# Patient Record
Sex: Male | Born: 1945 | Smoking: Current every day smoker
Health system: Southern US, Community
[De-identification: ages and names within clinical notes are randomized; demographics above are authoritative.]

## PROBLEM LIST (undated history)

## (undated) DIAGNOSIS — C801 Malignant (primary) neoplasm, unspecified: Secondary | ICD-10-CM

## (undated) HISTORY — PX: REVISION UROSTOMY CUTANEOUS: SUR1282

---

## 2018-03-23 ENCOUNTER — Encounter (HOSPITAL_COMMUNITY): Payer: Self-pay

## 2018-03-23 ENCOUNTER — Emergency Department (HOSPITAL_COMMUNITY): Payer: Medicare Other

## 2018-03-23 ENCOUNTER — Inpatient Hospital Stay (HOSPITAL_COMMUNITY)
Admission: EM | Admit: 2018-03-23 | Discharge: 2018-03-25 | DRG: 247 | Disposition: A | Discharge status: Home / Self Care | Payer: Medicare Other | Attending: Cardiology | Admitting: Cardiology

## 2018-03-23 ENCOUNTER — Encounter (HOSPITAL_COMMUNITY)
Admission: EM | Disposition: A | Discharge status: Home / Self Care | Payer: Self-pay | Source: Home / Self Care | Attending: Cardiology

## 2018-03-23 ENCOUNTER — Other Ambulatory Visit: Payer: Self-pay

## 2018-03-23 ENCOUNTER — Ambulatory Visit (HOSPITAL_COMMUNITY): Admit: 2018-03-23 | Payer: Medicare Other | Source: Home / Self Care | Admitting: Cardiology

## 2018-03-23 DIAGNOSIS — F17210 Nicotine dependence, cigarettes, uncomplicated: Secondary | ICD-10-CM | POA: Diagnosis present

## 2018-03-23 DIAGNOSIS — Z8551 Personal history of malignant neoplasm of bladder: Secondary | ICD-10-CM | POA: Diagnosis not present

## 2018-03-23 DIAGNOSIS — I25110 Atherosclerotic heart disease of native coronary artery with unstable angina pectoris: Secondary | ICD-10-CM | POA: Diagnosis present

## 2018-03-23 DIAGNOSIS — R079 Chest pain, unspecified: Secondary | ICD-10-CM | POA: Diagnosis present

## 2018-03-23 DIAGNOSIS — F17200 Nicotine dependence, unspecified, uncomplicated: Secondary | ICD-10-CM

## 2018-03-23 DIAGNOSIS — R739 Hyperglycemia, unspecified: Secondary | ICD-10-CM | POA: Diagnosis present

## 2018-03-23 DIAGNOSIS — I2119 ST elevation (STEMI) myocardial infarction involving other coronary artery of inferior wall: Principal | ICD-10-CM | POA: Diagnosis present

## 2018-03-23 DIAGNOSIS — E785 Hyperlipidemia, unspecified: Secondary | ICD-10-CM | POA: Diagnosis present

## 2018-03-23 DIAGNOSIS — Z906 Acquired absence of other parts of urinary tract: Secondary | ICD-10-CM

## 2018-03-23 DIAGNOSIS — I200 Unstable angina: Secondary | ICD-10-CM | POA: Diagnosis present

## 2018-03-23 DIAGNOSIS — I213 ST elevation (STEMI) myocardial infarction of unspecified site: Secondary | ICD-10-CM | POA: Insufficient documentation

## 2018-03-23 DIAGNOSIS — Z9861 Coronary angioplasty status: Secondary | ICD-10-CM

## 2018-03-23 DIAGNOSIS — Z955 Presence of coronary angioplasty implant and graft: Secondary | ICD-10-CM

## 2018-03-23 HISTORY — PX: LEFT HEART CATH AND CORONARY ANGIOGRAPHY: CATH118249

## 2018-03-23 HISTORY — PX: CORONARY STENT INTERVENTION: CATH118234

## 2018-03-23 HISTORY — DX: Malignant (primary) neoplasm, unspecified: C80.1

## 2018-03-23 LAB — BASIC METABOLIC PANEL
Anion gap: 7 (ref 5–15)
BUN: 10 mg/dL (ref 6–20)
CALCIUM: 8.7 mg/dL — AB (ref 8.9–10.3)
CO2: 25 mmol/L (ref 22–32)
CREATININE: 1.04 mg/dL (ref 0.61–1.24)
Chloride: 103 mmol/L (ref 101–111)
GFR calc non Af Amer: >60 mL/min (ref >60)
Glucose, Bld: 130 mg/dL — ABNORMAL HIGH (ref 65–99)
Potassium: 3.8 mmol/L (ref 3.5–5.1)
SODIUM: 135 mmol/L (ref 135–145)

## 2018-03-23 LAB — CBC
HCT: 45.4 % (ref 39.0–52.0)
Hemoglobin: 16 g/dL (ref 13.0–17.0)
MCH: 33.3 pg (ref 26.0–34.0)
MCHC: 35.2 g/dL (ref 30.0–36.0)
MCV: 94.6 fL (ref 78.0–100.0)
PLATELETS: 140 10*3/uL — AB (ref 150–400)
RBC: 4.8 MIL/uL (ref 4.22–5.81)
RDW: 12.8 % (ref 11.5–15.5)
WBC: 5.3 10*3/uL (ref 4.0–10.5)

## 2018-03-23 LAB — POCT ACTIVATED CLOTTING TIME: Activated Clotting Time: 301 seconds

## 2018-03-23 LAB — I-STAT TROPONIN, ED: TROPONIN I, POC: 0 ng/mL (ref 0.00–0.08)

## 2018-03-23 LAB — GLUCOSE, CAPILLARY: Glucose-Capillary: 108 mg/dL — ABNORMAL HIGH (ref 65–99)

## 2018-03-23 SURGERY — LEFT HEART CATH AND CORONARY ANGIOGRAPHY
Anesthesia: LOCAL

## 2018-03-23 MED ORDER — LIDOCAINE HCL (PF) 1 % IJ SOLN
INTRAMUSCULAR | Status: AC
  Filled 2018-03-23: qty 30

## 2018-03-23 MED ORDER — LIDOCAINE HCL (PF) 1 % IJ SOLN
INTRAMUSCULAR | Status: DC | PRN
  Administered 2018-03-23: 2 mL

## 2018-03-23 MED ORDER — FENTANYL CITRATE (PF) 100 MCG/2ML IJ SOLN
INTRAMUSCULAR | Status: DC | PRN
  Administered 2018-03-23 (×2): 25 ug via INTRAVENOUS

## 2018-03-23 MED ORDER — ATORVASTATIN CALCIUM 80 MG PO TABS
80.0000 mg | ORAL_TABLET | Freq: Every day | ORAL | Status: DC
  Administered 2018-03-23 – 2018-03-24 (×2): 80 mg via ORAL
  Filled 2018-03-23: qty 1
  Filled 2018-03-23: qty 2

## 2018-03-23 MED ORDER — VERAPAMIL HCL 2.5 MG/ML IV SOLN
INTRA_ARTERIAL | Status: DC | PRN
  Administered 2018-03-23: 5 mL via INTRA_ARTERIAL

## 2018-03-23 MED ORDER — ASPIRIN 81 MG PO CHEW
81.0000 mg | CHEWABLE_TABLET | Freq: Every day | ORAL | Status: DC
  Administered 2018-03-24 – 2018-03-25 (×2): 81 mg via ORAL
  Filled 2018-03-23 (×2): qty 1

## 2018-03-23 MED ORDER — SODIUM CHLORIDE 0.9 % IV SOLN
INTRAVENOUS | Status: DC
Start: 2018-03-23 — End: 2018-03-24
  Administered 2018-03-23: 17:00:00 via INTRAVENOUS

## 2018-03-23 MED ORDER — ONDANSETRON HCL 4 MG/2ML IJ SOLN: 4.0000 mg | Freq: Four times a day (QID) | INTRAMUSCULAR | Status: DC | PRN

## 2018-03-23 MED ORDER — TICAGRELOR 90 MG PO TABS
90.0000 mg | ORAL_TABLET | Freq: Two times a day (BID) | ORAL | Status: DC
  Administered 2018-03-24 – 2018-03-25 (×3): 90 mg via ORAL
  Filled 2018-03-23 (×3): qty 1

## 2018-03-23 MED ORDER — ATORVASTATIN CALCIUM 80 MG PO TABS: 80.0000 mg | ORAL_TABLET | Freq: Every day | ORAL | Status: DC

## 2018-03-23 MED ORDER — METOPROLOL SUCCINATE ER 25 MG PO TB24
12.5000 mg | ORAL_TABLET | Freq: Every day | ORAL | Status: DC
  Administered 2018-03-24 – 2018-03-25 (×2): 12.5 mg via ORAL
  Filled 2018-03-23 (×2): qty 1

## 2018-03-23 MED ORDER — IOPAMIDOL (ISOVUE-370) INJECTION 76%
INTRAVENOUS | Status: AC
  Filled 2018-03-23: qty 125

## 2018-03-23 MED ORDER — HEPARIN (PORCINE) IN NACL 100-0.45 UNIT/ML-% IJ SOLN
16.0000 [IU]/kg/h | INTRAMUSCULAR | Status: DC
  Administered 2018-03-23 (×2): 16 [IU]/kg/h via INTRAVENOUS
  Filled 2018-03-23: qty 250

## 2018-03-23 MED ORDER — HEPARIN SODIUM (PORCINE) 1000 UNIT/ML IJ SOLN
INTRAMUSCULAR | Status: DC | PRN
  Administered 2018-03-23: 8000 [IU] via INTRAVENOUS

## 2018-03-23 MED ORDER — SODIUM CHLORIDE 0.9 % WEIGHT BASED INFUSION
1.0000 mL/kg/h | INTRAVENOUS | Status: DC
  Administered 2018-03-23: 1 mL/kg/h via INTRAVENOUS

## 2018-03-23 MED ORDER — FENTANYL CITRATE (PF) 100 MCG/2ML IJ SOLN
INTRAMUSCULAR | Status: AC
  Filled 2018-03-23: qty 2

## 2018-03-23 MED ORDER — HEPARIN SODIUM (PORCINE) 5000 UNIT/ML IJ SOLN
4000.0000 [IU] | Freq: Once | INTRAMUSCULAR | Status: AC
  Administered 2018-03-23: 4000 [IU] via INTRAVENOUS

## 2018-03-23 MED ORDER — PNEUMOCOCCAL VAC POLYVALENT 25 MCG/0.5ML IJ INJ: 0.5000 mL | INJECTION | INTRAMUSCULAR | Status: DC | PRN

## 2018-03-23 MED ORDER — HEPARIN (PORCINE) IN NACL 2-0.9 UNITS/ML
INTRAMUSCULAR | Status: AC | PRN
  Administered 2018-03-23: 1000 mL via INTRA_ARTERIAL

## 2018-03-23 MED ORDER — SODIUM CHLORIDE 0.9% FLUSH: 3.0000 mL | INTRAVENOUS | Status: DC | PRN

## 2018-03-23 MED ORDER — SODIUM CHLORIDE 0.9 % IV SOLN
INTRAVENOUS | Status: AC | PRN
  Administered 2018-03-23: 999 mL/h via INTRAVENOUS

## 2018-03-23 MED ORDER — ACETAMINOPHEN 325 MG PO TABS
650.0000 mg | ORAL_TABLET | ORAL | Status: DC | PRN
  Administered 2018-03-24: 650 mg via ORAL
  Filled 2018-03-23: qty 2

## 2018-03-23 MED ORDER — NITROGLYCERIN 1 MG/10 ML FOR IR/CATH LAB
INTRA_ARTERIAL | Status: DC | PRN
  Administered 2018-03-23 (×2): 200 ug via INTRACORONARY

## 2018-03-23 MED ORDER — HEPARIN SODIUM (PORCINE) 1000 UNIT/ML IJ SOLN
INTRAMUSCULAR | Status: AC
  Filled 2018-03-23: qty 1

## 2018-03-23 MED ORDER — VERAPAMIL HCL 2.5 MG/ML IV SOLN
INTRAVENOUS | Status: AC
  Filled 2018-03-23: qty 2

## 2018-03-23 MED ORDER — SODIUM CHLORIDE 0.9 % IV SOLN: 250.0000 mL | INTRAVENOUS | Status: DC | PRN

## 2018-03-23 MED ORDER — SODIUM CHLORIDE 0.9% FLUSH
3.0000 mL | Freq: Two times a day (BID) | INTRAVENOUS | Status: DC
  Administered 2018-03-24: 3 mL via INTRAVENOUS

## 2018-03-23 MED ORDER — IOHEXOL 350 MG/ML SOLN
INTRAVENOUS | Status: DC | PRN
  Administered 2018-03-23: 50 mL via INTRA_ARTERIAL

## 2018-03-23 MED ORDER — TICAGRELOR 90 MG PO TABS
180.0000 mg | ORAL_TABLET | Freq: Once | ORAL | Status: AC
  Administered 2018-03-23: 180 mg via ORAL
  Filled 2018-03-23: qty 2

## 2018-03-23 MED ORDER — IOPAMIDOL (ISOVUE-370) INJECTION 76%
INTRAVENOUS | Status: DC | PRN
  Administered 2018-03-23: 115 mL via INTRA_ARTERIAL

## 2018-03-23 MED ORDER — MIDAZOLAM HCL 2 MG/2ML IJ SOLN
INTRAMUSCULAR | Status: DC | PRN
  Administered 2018-03-23: 2 mg via INTRAVENOUS

## 2018-03-23 MED ORDER — MIDAZOLAM HCL 2 MG/2ML IJ SOLN
INTRAMUSCULAR | Status: AC
  Filled 2018-03-23: qty 2

## 2018-03-23 SURGICAL SUPPLY — 17 items
BALLN SAPPHIRE 2.5X15 (BALLOONS) ×2
BALLOON SAPPHIRE 2.5X15 (BALLOONS) ×1 IMPLANT
CATH OPTITORQUE TIG 4.0 5F (CATHETERS) ×2 IMPLANT
CATH VISTA GUIDE 6FR JR4 (CATHETERS) ×2 IMPLANT
DEVICE RAD COMP TR BAND LRG (VASCULAR PRODUCTS) ×2 IMPLANT
ELECT DEFIB PAD ADLT CADENCE (PAD) ×2 IMPLANT
GLIDESHEATH SLEND A-KIT 6F 20G (SHEATH) ×2 IMPLANT
GUIDEWIRE INQWIRE 1.5J.035X260 (WIRE) ×1 IMPLANT
INQWIRE 1.5J .035X260CM (WIRE) ×2
KIT ENCORE 26 ADVANTAGE (KITS) ×2 IMPLANT
KIT HEART LEFT (KITS) ×2 IMPLANT
PACK CARDIAC CATHETERIZATION (CUSTOM PROCEDURE TRAY) ×2 IMPLANT
STENT SIERRA 4.00 X 12 MM (Permanent Stent) ×2 IMPLANT
STENT SIERRA 4.00 X 33 MM (Permanent Stent) ×2 IMPLANT
TRANSDUCER W/STOPCOCK (MISCELLANEOUS) ×2 IMPLANT
TUBING CIL FLEX 10 FLL-RA (TUBING) ×2 IMPLANT
WIRE COUGAR XT STRL 190CM (WIRE) ×2 IMPLANT

## 2018-03-23 NOTE — ED Provider Notes (Signed)
[facility] EMERGENCY DEPARTMENT Provider Note   CSN: [id number] Arrival date & time: 03/23/18  1637     History   Chief Complaint Chief Complaint  Patient presents with  . Chest Pain    HPI Cole Garner is a 72 y.o. male.  He has no prior known cardiac history.  Remote history of bladder cancer and a urostomy.  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  Today while going into the [place] to get some food he experienced some substernal chest pressure radiating to his left arm with some associated tingling in the left arm and diaphoresis.  He states he might of been a little short of breath at that time 2.  He is never experienced anything like this before.  EMS was called and they did an EKG which is suspicious for some EKG changes and gave him 324 of aspirin one nitro with improvement in his symptoms.  Currently he states he is got 7 out of 10 pressure across his chest and still little tingling in the left arm.  There is no radiation into the back no pain in the legs.  He has no prior history of cardiac disease no prior EKGs in the system and no cardiac testing known.  The history is provided by the patient and the EMS personnel.  Chest Pain   This is a new problem. The current episode started less than 1 hour ago. The problem has been gradually improving. The pain is associated with walking. The pain is present in the substernal region. The pain is at a severity of 7/10. The quality of the pain is described as pressure-like. The pain radiates to the left shoulder and left arm. Associated symptoms include diaphoresis. Pertinent negatives include no abdominal pain, no back pain, no claudication, no cough, no fever, no headaches, no hemoptysis, no nausea, no near-syncope, no numbness, no palpitations, no shortness of breath, no sputum production, no syncope and no vomiting. He has tried nitroglycerin for the symptoms. The treatment provided moderate relief. Risk factors include male  gender and smoking/tobacco exposure.  His past medical history is significant for cancer.  Pertinent negatives for past medical history include no seizures.    Past Medical History:  Diagnosis Date  . Cancer [facility])    bladder cancer    There are no active problems to display for this patient.   Past Surgical History:  Procedure Laterality Date  . REVISION UROSTOMY CUTANEOUS          Home Medications    Prior to Admission medications   Not on File    Family History No family history on file.  Social History Social History   Tobacco Use  . Smoking status: Current Every Day Smoker  . Smokeless tobacco: Never Used  Substance Use Topics  . Alcohol use: Never    Frequency: Never  . Drug use: Never     Allergies   Patient has no known allergies.   Review of Systems Review of Systems  Constitutional: Positive for diaphoresis. Negative for chills and fever.  HENT: Negative for ear pain and sore throat.   Eyes: Negative for pain and visual disturbance.  Respiratory: Negative for cough, hemoptysis, sputum production and shortness of breath.   Cardiovascular: Positive for chest pain. Negative for palpitations, claudication, syncope and near-syncope.  Gastrointestinal: Negative for abdominal pain, nausea and vomiting.  Genitourinary: Negative for hematuria.  Musculoskeletal: Negative for arthralgias and back pain.  Skin: Negative for color change and  rash.  Neurological: Negative for seizures, syncope, numbness and headaches.  All other systems reviewed and are negative.    Physical Exam Updated Vital Signs BP 123/73 (BP Location: Right Arm)   Pulse (!) 56   Temp 97.6 F (36.4 C) (Oral)   Resp 18   Ht 5\' 11"  (1.803 m)   Wt 81.6 kg (180 lb)   SpO2 98%   BMI 25.10 kg/m   Physical Exam  Constitutional: He appears well-developed and well-nourished.  HENT:  Head: Normocephalic and atraumatic.  Eyes: Conjunctivae are normal.  Neck: Neck supple.    Cardiovascular: Normal rate, regular rhythm and normal pulses.  No murmur heard. Pulmonary/Chest: Effort normal and breath sounds normal. No respiratory distress.  Abdominal: Soft. There is no tenderness.  Urostomy present lower abdomen nontender.  Musculoskeletal: Normal range of motion. He exhibits no edema, tenderness or deformity.  Neurological: He is alert. He has normal strength. No sensory deficit. GCS eye subscore is 4. GCS verbal subscore is 5. GCS motor subscore is 6.  Skin: Skin is warm and dry.  Psychiatric: He has a normal mood and affect.  Nursing note and vitals reviewed.    ED Treatments / Results  Labs (all labs ordered are listed, but only abnormal results are displayed) Labs Reviewed  BASIC METABOLIC PANEL - Abnormal; Notable for the following components:      Result Value   Glucose, Bld 130 (*)    Calcium 8.7 (*)    All other components within normal limits  CBC - Abnormal; Notable for the following components:   Platelets 140 (*)    All other components within normal limits  I-STAT TROPONIN, ED    EKG EKG Interpretation  Date/Time:  Tuesday March 23 2018 16:41:26 EDT Ventricular Rate:  56 PR Interval:    QRS Duration: 90 QT Interval:  421 QTC Calculation: 407 R Axis:   73 Text Interpretation:  Sinus rhythm Repol abnrm suggests ischemia, anterolateral Minimal ST elevation, inferior leads IMI recip lateral depressions no old ecg to compare with Confirmed by Aletta Edouard [phone]) on 03/23/2018 5:06:04 PM   Radiology Dg Chest Port 1 View  Result Date: 03/23/2018 CLINICAL DATA:  Chest pain with tingling sensation across chest radiating to LEFT shoulder, diaphoretic, smoker, history bladder cancer EXAM: PORTABLE CHEST 1 VIEW COMPARISON:  Portable exam 1715 hours without priors for comparison FINDINGS: Normal heart size, mediastinal contours, and pulmonary vascularity. Bibasilar atelectasis. Lungs otherwise clear. No pleural effusion or pneumothorax. No  definite osseous abnormalities. IMPRESSION: Bibasilar atelectasis. Electronically Signed   By: Lavonia Dana M.D.   On: 03/23/2018 17:41    Procedures .Critical Care Performed by: Hayden Rasmussen, MD Authorized by: Hayden Rasmussen, MD   Critical care provider statement:    Critical care time (minutes):  30   Critical care time was exclusive of:  Separately billable procedures and treating other patients   Critical care was necessary to treat or prevent imminent or life-threatening deterioration of the following conditions:  Cardiac failure   Critical care was time spent personally by me on the following activities:  Development of treatment plan with patient or surrogate, discussions with consultants, evaluation of patient's response to treatment, examination of patient, obtaining history from patient or surrogate, ordering and performing treatments and interventions, ordering and review of laboratory studies, ordering and review of radiographic studies, pulse oximetry, re-evaluation of patient's condition and review of old charts   I assumed direction of critical care for this patient from another  provider in my specialty: no     (including critical care time)  Medications Ordered in ED Medications  0.9 %  sodium chloride infusion ( Intravenous New Bag/Given 03/23/18 1715)  heparin ADULT infusion 100 units/mL (25000 units/238mL sodium chloride 0.45%) (16 Units/kg/hr  81.6 kg Intravenous New Bag/Given 03/23/18 1710)  atorvastatin (LIPITOR) tablet 80 mg ( Oral Automatically Held 03/31/18 1800)  midazolam (VERSED) injection (2 mg Intravenous Given 03/23/18 1810)  fentaNYL (SUBLIMAZE) injection (25 mcg Intravenous Given 03/23/18 1810)  lidocaine (PF) (XYLOCAINE) 1 % injection (2 mLs Infiltration Given 03/23/18 1812)  heparin infusion 2 units/mL in 0.9 % sodium chloride (1,000 mLs Intra-arterial New Bag/Given 03/23/18 1812)  Radial Cocktail (Verapamil 2.5 mg, NTG, Lidocaine) (5 mLs Intra-arterial  Given 03/23/18 1815)  0.9 %  sodium chloride infusion (999 mL/hr Intravenous New Bag/Given 03/23/18 1816)  heparin injection (8,000 Units Intravenous Given 03/23/18 1818)  heparin injection 4,000 Units (4,000 Units Intravenous Given 03/23/18 1712)  ticagrelor (BRILINTA) tablet 180 mg (180 mg Oral Given 03/23/18 1710)     Initial Impression / Assessment and Plan / ED Course  I have reviewed the triage vital signs and the nursing notes.  Pertinent labs & imaging results that were available during my care of the patient were reviewed by me and considered in my medical decision making (see chart for details).  Clinical Course as of Mar 23 1817  Tue Mar 23, 2018  1656 Discussed with Dr. Einar Gip the [facility] attending.  He is recommending heparin bolus and infusion Brilinta 180 chewable Lipitor 80 and [facility] is on the way to transport the patient to [facility] for STEMI activation.   [MB]  6226 I updated the patient on his condition and the need for transfer down to [facility] for cardiology evaluation.  Patient is agreeable to plan.   [MB]    Clinical Course User Index [MB] Hayden Rasmussen, MD     Final Clinical Impressions(s) / ED Diagnoses   Final diagnoses:  ST elevation myocardial infarction (STEMI), unspecified artery [facility])    ED Discharge Orders    None       Hayden Rasmussen, MD 03/24/18 1732

## 2018-03-23 NOTE — ED Triage Notes (Signed)
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  Reports was in town today and started having chest pain and was diaphoretic while walking out of a convenient store.  CP radiated from center of chest to left shoulder.  Pt had some chest pain a few days ago and was told he may have had a panic attack.  EMS was called and pt was given 1 SL nitro and 324mg  asa.  CP was relieved with 1 nitro.  Pt has 20g IV in R wrist.  Initial EKG showed some st depression in v2 and v3 but ems says it cleared.

## 2018-03-23 NOTE — Plan of Care (Signed)
  Problem: Education: Goal: Knowledge of General Education information will improve Outcome: Progressing   Problem: Health Behavior/Discharge Planning: Goal: Ability to manage health-related needs will improve Outcome: Progressing   Problem: Clinical Measurements: Goal: Ability to maintain clinical measurements within normal limits will improve Outcome: Progressing Goal: Will remain free from infection Outcome: Progressing Goal: Diagnostic test results will improve Outcome: Progressing Goal: Respiratory complications will improve Outcome: Progressing Goal: Cardiovascular complication will be avoided Outcome: Progressing   Problem: Activity: Goal: Risk for activity intolerance will decrease Outcome: Progressing   Problem: Nutrition: Goal: Adequate nutrition will be maintained Outcome: Progressing   Problem: Coping: Goal: Level of anxiety will decrease Outcome: Progressing   Problem: Elimination: Goal: Will not experience complications related to bowel motility Outcome: Progressing Goal: Will not experience complications related to urinary retention Outcome: Progressing   Problem: Pain Managment: Goal: General experience of comfort will improve Outcome: Progressing   Problem: Safety: Goal: Ability to remain free from injury will improve Outcome: Progressing   Problem: Skin Integrity: Goal: Risk for impaired skin integrity will decrease Outcome: Progressing   Problem: Education: Goal: Understanding of cardiac disease, CV risk reduction, and recovery process will improve Outcome: Progressing Goal: Understanding of medication regimen will improve Outcome: Progressing   Problem: Activity: Goal: Ability to tolerate increased activity will improve Outcome: Progressing   Problem: Cardiac: Goal: Ability to achieve and maintain adequate cardiopulmonary perfusion will improve Outcome: Progressing Goal: Vascular access site(s) Level 0-1 will be maintained Outcome:  Progressing   Problem: Health Behavior/Discharge Planning: Goal: Ability to safely manage health-related needs after discharge will improve Outcome: Progressing   

## 2018-03-23 NOTE — H&P (Signed)
Cole Garner is an 72 y.o. male.   Chief  Complaint: Chest pain HPI: Cole Garner  is a 72 y.o. 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  He called the EMS and was taken to [facility] where EKG revealed early suggestion of acute inferior ST elevation MI.  He was transferred emergently to [facility] for urgent cardiac catheterization.  With aggressive medical therapy, he is presently chest pain-free.  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  There is no family history of premature coronary artery disease.  Past Medical History:  Diagnosis Date  . Cancer [facility])    bladder cancer    Past Surgical History:  Procedure Laterality Date  . REVISION UROSTOMY CUTANEOUS      No family history on file. Social History:  reports that he has been smoking.  He has never used smokeless tobacco. He reports that he does not drink alcohol or use drugs.  Allergies: No Known Allergies  Review of Systems -except for chest pain, all other systems are negative.  Blood pressure 109/74, pulse (!) 56, temperature 97.6 F (36.4 C), temperature source Oral, resp. rate 14, height '5\' 11"'  (1.803 m), weight 81.6 kg (180 lb), SpO2 100 %. Body mass index is 25.1 kg/m. Physical Exam  Constitutional: He is oriented to person, place, and time. He appears well-developed and well-nourished. No distress.  HENT:  Head: Atraumatic.  Eyes: Conjunctivae are normal.  Neck: Normal range of motion. Neck supple.  Cardiovascular: Normal rate, regular rhythm, normal heart sounds and intact distal pulses. Exam reveals no gallop and no  friction rub.  No murmur heard. Pulmonary/Chest: Effort normal and breath sounds normal.  Abdominal: Soft. Bowel sounds are normal.  Urostomy noted and appears healthy  Musculoskeletal: Normal range of motion. He exhibits no edema or tenderness.  Neurological: He is alert and oriented to person, place, and time.  Skin: Skin is warm and dry.  Psychiatric: He has a normal mood and affect.   Results for orders placed or performed during the hospital encounter of 03/23/18 (from the past 48 hour(s))  Basic metabolic panel     Status: Abnormal   Collection Time: 03/23/18  4:56 PM  Result Value Ref Range   Sodium 135 135 - 145 mmol/L   Potassium 3.8 3.5 - 5.1 mmol/L   Chloride 103 101 - 111 mmol/L   CO2 25 22 - 32 mmol/L   Glucose, Bld 130 (H) 65 - 99 mg/dL   BUN 10 6 - 20 mg/dL   Creatinine, Ser 1.04 0.61 - 1.24 mg/dL   Calcium 8.7 (L) 8.9 - 10.3 mg/dL   GFR calc non Af Amer >60 >60 mL/min   GFR calc Af Amer >60 >60 mL/min    Comment: (NOTE) The eGFR has been calculated using the CKD EPI equation. This calculation has not been validated in all clinical situations. eGFR's persistently <60 mL/min signify possible Chronic Kidney Disease.    Anion gap 7 5 - 15    Comment: Performed at [facility], [address] [place], [place] [address]  CBC     Status: Abnormal  Collection Time: 03/23/18  4:56 PM  Result Value Ref Range   WBC 5.3 4.0 - 10.5 K/uL   RBC 4.80 4.22 - 5.81 MIL/uL   Hemoglobin 16.0 13.0 - 17.0 g/dL   HCT 45.4 39.0 - 52.0 %   MCV 94.6 78.0 - 100.0 fL   MCH 33.3 26.0 - 34.0 pg   MCHC 35.2 30.0 - 36.0 g/dL   RDW 12.8 11.5 - 15.5 %   Platelets 140 (L) 150 - 400 K/uL    Comment: Performed at [facility], [address], [place], [place] [address]  I-stat troponin, ED     Status: None   Collection Time: 03/23/18  5:01 PM  Result Value Ref Range   Troponin i, poc 0.00 0.00 - 0.08 ng/mL   Comment 3            Comment: Due to the release kinetics of cTnI, a negative  result within the first hours of the onset of symptoms does not rule out myocardial infarction with certainty. If myocardial infarction is still suspected, repeat the test at appropriate intervals.     Labs:   Lab Results  Component Value Date   WBC 5.3 03/23/2018   HGB 16.0 03/23/2018   HCT 45.4 03/23/2018   MCV 94.6 03/23/2018   PLT 140 (L) 03/23/2018    Recent Labs  Lab 03/23/18 1656  NA 135  K 3.8  CL 103  CO2 25  BUN 10  CREATININE 1.04  CALCIUM 8.7*  GLUCOSE 130*     Current Facility-Administered Medications:  .  0.9 %  sodium chloride infusion, , Intravenous, Continuous, Hayden Rasmussen, MD, Last Rate: 20 mL/hr at 03/23/18 1715 .  [MAR Hold] atorvastatin (LIPITOR) tablet 80 mg, 80 mg, Oral, q1800, Hayden Rasmussen, MD, 80 mg at 03/23/18 1710 .  heparin ADULT infusion 100 units/mL (25000 units/230m sodium chloride 0.45%), 16 Units/kg/hr, Intravenous, Continuous, BHayden Rasmussen MD, Last Rate: 13 mL/hr at 03/23/18 1710, 16 Units/kg/hr at 03/23/18 1710  CARDIAC STUDIES:  EKG 03/23/2018 at 1641 hrs.: Sinus bradycardia at rate of 56 bpm, ST elevation noted in the inferior leads with reciprocal ST depression in 1 and aVL suggestive of acute ST elevation myocardial infarction.  ST elevation 1 mm in lead III, 0.5 mm in aVF.  Repeat EKG at 1752 hrs.: Sinus bradycardia at rate of 48 bpm, otherwise normal EKG.  Previously noted ST changes no longer present.  1. Assessment/Plan Unstable angina pectoris with dynamic EKG changes, improved with aspirin, IV heparin, oral loading of Brilinta.  Lipitor 80 mg was given.  Presently chest pain-free. 2.  Tobacco use disorder, 53-pack-year history of smoking. 3.  History of urinary bladder cancer status post cystectomy and presence of suprapubic urostomy.  Recommendation: Patient will need urgent cardiac catheterization in view of dynamic EKG changes.  He will need aggressive risk factor modification including smoking  cessation.  Lipids have been sent and we will follow-up on the same.  JAdrian Prows MD 03/23/2018, 6:01 PM [facility]. PNew HopePager: [phone] Office: [phone] no answer: Cell:  [phone]
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
## 2018-03-23 NOTE — Progress Notes (Signed)
Breakfast tray ordered 

## 2018-03-24 ENCOUNTER — Other Ambulatory Visit: Payer: Self-pay

## 2018-03-24 ENCOUNTER — Encounter (HOSPITAL_COMMUNITY): Payer: Self-pay | Admitting: Cardiology

## 2018-03-24 LAB — TROPONIN I
TROPONIN I: 0.48 ng/mL — AB (ref <0.03)
Troponin I: 0.09 ng/mL (ref <0.03)
Troponin I: 0.43 ng/mL (ref <0.03)

## 2018-03-24 LAB — MRSA PCR SCREENING: MRSA by PCR: NEGATIVE

## 2018-03-24 LAB — CBC
HCT: 44 % (ref 39.0–52.0)
Hemoglobin: 15.2 g/dL (ref 13.0–17.0)
MCH: 32.7 pg (ref 26.0–34.0)
MCHC: 34.5 g/dL (ref 30.0–36.0)
MCV: 94.6 fL (ref 78.0–100.0)
PLATELETS: 143 10*3/uL — AB (ref 150–400)
RBC: 4.65 MIL/uL (ref 4.22–5.81)
RDW: 12.9 % (ref 11.5–15.5)
WBC: 7.8 10*3/uL (ref 4.0–10.5)

## 2018-03-24 LAB — LIPID PANEL
CHOL/HDL RATIO: 4 ratio
Cholesterol: 117 mg/dL (ref 0–200)
HDL: 29 mg/dL — AB (ref >40)
LDL CALC: 77 mg/dL (ref 0–99)
TRIGLYCERIDES: 55 mg/dL (ref <150)
VLDL: 11 mg/dL (ref 0–40)

## 2018-03-24 LAB — BASIC METABOLIC PANEL
Anion gap: 9 (ref 5–15)
BUN: 8 mg/dL (ref 6–20)
CALCIUM: 8.5 mg/dL — AB (ref 8.9–10.3)
CHLORIDE: 107 mmol/L (ref 101–111)
CO2: 21 mmol/L — ABNORMAL LOW (ref 22–32)
CREATININE: 1 mg/dL (ref 0.61–1.24)
GFR calc non Af Amer: >60 mL/min (ref >60)
Glucose, Bld: 123 mg/dL — ABNORMAL HIGH (ref 65–99)
Potassium: 3.6 mmol/L (ref 3.5–5.1)
SODIUM: 137 mmol/L (ref 135–145)

## 2018-03-24 NOTE — Progress Notes (Signed)
Patient upset re:  Blood having to be drawn.  Stated "if one more person sticks me with a needle, I'm leaving this place!"  MI teaching done with patient, including the following:  risk factors, activity, medications, dietary, and stent teaching.  Patient will require reinforcement re:  the aforementioned.  Teach-back method employed and teaching materials included in exit care for patient on discharge.

## 2018-03-24 NOTE — Progress Notes (Signed)
Critical troponin 0.09. Expected value, will continue to trend. Pt has denied any chest pain or pressure since arrival to ICU.   Will continue to monitor.

## 2018-03-24 NOTE — Progress Notes (Signed)
Ordered pt dinner tray.

## 2018-03-24 NOTE — Care Management (Signed)
Brilinta benefit check 3. PRIMARY INS: [email or web address].GOV- NO RX COVERAGE   Attending made aware

## 2018-03-24 NOTE — Progress Notes (Signed)
Subjective:  Feels much better. No further chest pain  Objective:  Vital Signs in the last 24 hours: Temp:  [97.6 F (36.4 C)-98.9 F (37.2 C)] 98.7 F (37.1 C) (06/19 0800) Pulse Rate:  [44-85] 55 (06/19 0700) Resp:  [10-20] 14 (06/19 0700) BP: (94-147)/(55-86) 110/60 (06/19 0654) SpO2:  [93 %-100 %] 95 % (06/19 0700) Weight:  [76.3 kg (168 lb 3.4 oz)-81.6 kg (180 lb)] 76.3 kg (168 lb 3.4 oz) (06/19 0600)  Intake/Output from previous day: 06/18 0701 - 06/19 0700 In: 281.8 [P.O.:60; I.V.:221.8] Out: 1200 [Urine:1200]  Physical Exam: Blood pressure 110/60, pulse (!) 55, temperature 98.7 F (37.1 C), temperature source Oral, resp. rate 14, height 5\' 11"  (1.803 m), weight 76.3 kg (168 lb 3.4 oz), SpO2 95 %.  Physical Exam  Constitutional: He is oriented to person, place, and time and well-developed, well-nourished, and in no distress. No distress.  HENT:  Head: Atraumatic.  Eyes: Conjunctivae are normal.  Neck: Normal range of motion. Neck supple. JVD present.  Cardiovascular: Normal rate, regular rhythm, normal heart sounds and intact distal pulses. Exam reveals no gallop.  No murmur heard. Bilateral soft carotid bruit present  Pulmonary/Chest: Effort normal and breath sounds normal. No stridor. No respiratory distress.  Abdominal: Soft. Bowel sounds are normal.  Musculoskeletal: Normal range of motion.  Neurological: He is alert and oriented to person, place, and time.  Skin: Skin is warm and dry. He is not diaphoretic.  Psychiatric: Affect normal.    Lab Results: BMP Recent Labs    03/23/18 1656 03/24/18 0251  NA 135 137  K 3.8 3.6  CL 103 107  CO2 25 21*  GLUCOSE 130* 123*  BUN 10 8  CREATININE 1.04 1.00  CALCIUM 8.7* 8.5*  GFRNONAA >60 >60  GFRAA >60 >60    CBC Recent Labs  Lab 03/24/18 0251  WBC 7.8  RBC 4.65  HGB 15.2  HCT 44.0  PLT 143*  MCV 94.6  MCH 32.7  MCHC 34.5  RDW 12.9   Cardiac Panel (last 3 results) Recent Labs     03/23/18 2008 03/24/18 0251 03/24/18 0923  TROPONINI 0.09* 0.43* 0.48*    Lipid Panel     Component Value Date/Time   CHOL 117 03/23/2018 2008   TRIG 55 03/23/2018 2008   HDL 29 (L) 03/23/2018 2008   CHOLHDL 4.0 03/23/2018 2008   VLDL 11 03/23/2018 2008   LDLCALC 77 03/23/2018 2008   Imaging: Dg Chest Port 1 View  Result Date: 03/23/2018 CLINICAL DATA:  Chest pain with tingling sensation across chest radiating to LEFT shoulder, diaphoretic, smoker, history bladder cancer EXAM: PORTABLE CHEST 1 VIEW COMPARISON:  Portable exam 1715 hours without priors for comparison FINDINGS: Normal heart size, mediastinal contours, and pulmonary vascularity. Bibasilar atelectasis. Lungs otherwise clear. No pleural effusion or pneumothorax. No definite osseous abnormalities. IMPRESSION: Bibasilar atelectasis. Electronically Signed   By: Lavonia Dana M.D.   On: 03/23/2018 17:41    Cardiac Studies:  EKG 03/24/2018: Sinus bradycardia at rate of 53 bpm, normal axis, nonspecific T abnormality.  No evidence of ischemia.  ECHO: Ordered  Assessment/Plan:  1. NSTEMI 2. CAD involving RCA  3. Mild hyperlipidemia/dyslipippdemia (HDL reduced) 4. Tobacco use disorder 5. Bilateral soft bruit 6. Hyperglycemia  Rec: Transfer to tele. Needs OP carotid duplex once he goes back to [place]. Discussed that he should not drive for at least 10 days. Discussed smoking cessation and compliance with antiplatelet therapy.    Adrian Prows, M.D. 03/24/2018, 10:51 AM  [facility], [place] Pager: [phone] Office: [phone] If no answer: [phone]

## 2018-03-24 NOTE — Progress Notes (Addendum)
CARDIAC REHAB PHASE I   PRE:  Rate/Rhythm: 51 SB  BP:  Supine:   Sitting: 130/63  Standing:    SaO2: 97%RA  MODE:  Ambulation: 370 ft   POST:  Rate/Rhythm: 55 SB  BP:  Supine:   Sitting: 87/60, 124/79  Standing:    SaO2: 98%RA 1045-1200 Pt walked 370 ft on RA with steady gait and no CP or dizziness. MI education completed with pt who voiced understanding. Stressed importance of brilinta with stent. Needs to see case manager as he will be going back to [place] on discharge. Will need brilinta. Notified RN to put in case manager consult. Discussed MI restrictions, NTG use, ex ed, heart healthy food choices, smoking cessation. Gave pt fake cigarette and smoking cessation handout. He stated his girlfriend was going to try to quit also. Discussed CRP 2. Pt is planning on returning to work as soon as he can. He is a Administrator and works out of town.  He knows he needs to get cardiologist in [place].  He stated the closest hospital is [facility] in [place]. Will send letter of interest if they have CRP 2 even though pt will not be able to attend with work schedule. If this hospital does not have program.,cardiologist in [place] can refer if pt interested and work allows.(  Put in referral for [facility] after investigating program.)   Graylon Good, RN BSN  03/24/2018 11:50 AM

## 2018-03-24 NOTE — Care Management Note (Addendum)
Case Management Note  Patient Details  Name: Cole Garner MRN: [id number] Date of Birth: 1946/01/01  Subjective/Objective:      Pt admitted with  NSTEMI           Action/Plan:  PTA independent from home, pt works as a Engineer, petroleum. Pt plans to return back to [place] at discharge - girlfriend will drive pt home.  CM provided free 30 day Brilinta card and provided [facility] [address] to [facility] prescription.  CM also requested pt to contact [facility] to also inquire about copay/asssitance.   CM submitted benefit check for Brilinta.  Pt informed CM that he utilizes an Ur[facility] in [place], [place] called clinic and clinic confirmed they have seen pt in the past however informed that doctors in urgent care are not PCPs.  NO information was requested to be sent to office.  CM informed attending of information above - attending informed CM that he will provide a CD to pt at discharge with all clinicals and pt will be responsible for taking data to Urgent Care group for continuity of care    Expected Discharge Date:                  Expected Discharge Plan:  Home/Self Care  In-House Referral:     Discharge planning Services  CM Consult  Post Acute Care Choice:    Choice offered to:     DME Arranged:    DME Agency:     HH Arranged:    HH Agency:     Status of Service:  In process, will continue to follow  If discussed at Long Length of Stay Meetings, dates discussed:    Additional Comments: Benefit check yielded pt does not have prescription coverage, CM will follow up with attending Maryclare Labrador, RN 03/24/2018, 2:51 PM

## 2018-03-25 ENCOUNTER — Inpatient Hospital Stay (HOSPITAL_COMMUNITY): Payer: Medicare Other

## 2018-03-25 LAB — ECHOCARDIOGRAM COMPLETE
Height: 70 in
WEIGHTICAEL: 2678.4 [oz_av]

## 2018-03-25 MED ORDER — METOPROLOL SUCCINATE ER 25 MG PO TB24: 12.5000 mg | ORAL_TABLET | Freq: Every day | ORAL | 3 refills | Status: DC

## 2018-03-25 MED ORDER — ATORVASTATIN CALCIUM 80 MG PO TABS: 80.0000 mg | ORAL_TABLET | Freq: Every day | ORAL | 3 refills | Status: AC

## 2018-03-25 MED ORDER — TICAGRELOR 90 MG PO TABS: 90.0000 mg | ORAL_TABLET | Freq: Two times a day (BID) | ORAL | 3 refills | Status: AC

## 2018-03-25 MED ORDER — ASPIRIN 81 MG PO CHEW: 81.0000 mg | CHEWABLE_TABLET | Freq: Every day | ORAL | 3 refills | Status: AC

## 2018-03-25 MED ORDER — NICOTINE 14 MG/24HR TD PT24: 14.0000 mg | MEDICATED_PATCH | TRANSDERMAL | 3 refills | Status: AC

## 2018-03-25 NOTE — Progress Notes (Signed)
  Echocardiogram 2D Echocardiogram has been performed.  Tore Carreker L Androw 03/25/2018, 12:13 PM

## 2018-03-25 NOTE — Progress Notes (Signed)
Patient discharge to home, alert and oriented, no complaints of any pain or discomfort. MD in the room talking to pt and family. D/c instructions and follow up appointment discussed with patient, verbalized understanding.

## 2018-03-25 NOTE — Progress Notes (Signed)
1130-1150 Pt is gone for ECHO but significant other and her daughter in room. Reviewed importance of brilinta with stent and that pt will need his dose tonight. S.O. Stated she wants to quit also and requested fake cigarette. Gave her fake cigarette and smoking cessation sheet. Also gave another copy of heart healthy diet and discussed healthy food choices. They stated pt has been up walking so will not return to walk. Pt has ed materials in room from our session yesterday. Referring the pt to [facility] in [place], [place]. Graylon Good RN BSN 03/25/2018 11:53 AM

## 2018-03-25 NOTE — Care Management Note (Signed)
Case Management Note  Patient Details  Name: Cole Garner MRN: [id number] Date of Birth: [birth year]  Subjective/Objective:      Pt admitted with  NSTEMI           Action/Plan:  PTA independent from home, pt works as a Engineer, petroleum. Pt plans to return back to [place] at discharge - girlfriend will drive pt home.  CM provided free 30 day Brilinta card and provided [facility] [address] to [facility].  CM also requested pt to contact [facility] to also inquire about copay/asssitance.   CM submitted benefit check for Brilinta.  Pt informed CM that he utilizes an Ur[facility] in [place], [place] called clinic and clinic confirmed they have seen pt in the past however informed that doctors in urgent care are not PCPs.  NO information was requested to be sent to office.  CM informed attending of information above - attending informed CM that he will provide a CD to pt at discharge with all clinicals and pt will be responsible for taking data to Urgent Care group for continuity of care    Expected Discharge Date:                  Expected Discharge Plan:  Home/Self Care  In-House Referral:     Discharge planning Services  CM Consult  Post Acute Care Choice:    Choice offered to:     DME Arranged:    DME Agency:     HH Arranged:    HH Agency:     Status of Service:  In process, will continue to follow  If discussed at Long Length of Stay Meetings, dates discussed:    Additional Comments: 03/25/2018 CM spoke with pts girlfriend - pt has prescription coverage however his cards are in [place].  CM explained to girlfriend that the first 30 days will be free for Brilinta with the card.  [facility] [address].  Girlfriend and other family member will drive pt back to [place] at discharge  03/24/18 Benefit check yielded pt does not have prescription coverage, CM will follow up with attending Maryclare Labrador, RN 03/25/2018, 9:37 AM

## 2018-03-25 NOTE — Discharge Summary (Signed)
Physician Discharge Summary  Patient ID: Cole Garner MRN: [id number] DOB/AGE: Feb 06, 1946 72 y.o.  Admit date: 03/23/2018 Discharge date: 03/25/2018  Admission Diagnoses:Chest pain   Discharge Diagnoses:  Principal Problem:   NSTEMI (non-ST elevated myocardial infarction) [facility]) Active Problems:   Unstable angina ([place])   Tobacco use disorder   Post PTCA   Discharged Condition: good  Hospital Course:   72 year old Caucasian male with tobacco abuse, truck driver originally from [place], admitted to [facility] after episode of chest pain, EKG revealing inferior STEMI. Patient underwent coronary angiogram that showed severe proximal-mid RCA culprit stenosis, mild mid circumflex non-culprit stenosis. He underwent successful primary PCI with 2 overlapping 4.0 x 33 mm and 4.0 x 12 mm Xience Sierra drug eluting stent placement to proximal-mid RCA. He was started on dual antiplatelet therapy with aspirin, Brilinta, low-dose metoprolol, and nicotine patch for his tobacco abuse. Echocardiogram showed preserved LVEF.   Patient ablated without any chest pain. Prescriptions were given to the patient and to take to her local pharmacy in [place]. Not ob beta blocker due to asymptomatic sinus bradycardia (HR in 40s and 50s). Patient does not have a primary care provider or cardiologist, but would like to see Dr. Shearon Stalls in [place], [place] as his cardiologist. I'm giving copy of patient's discharge summary for her to take with him for appropriate transition of care. He will need office visit for post MI follow-up, as well as consider bilateral carotid ultrasound for soft carotid bruit on outpatient basis.  Consults: None  Significant Diagnostic Studies: labs:   Echocardiogram: Study Conclusions  - Left ventricle: The cavity size was normal. The estimated   ejection fraction was in the range of 55% to 60%. Wall motion was   normal; there were no regional wall motion  abnormalities. - Mitral valve: There was mild regurgitation. - Inadequate TR jet to estimate PASP. Normal right atrial pressure.  Results for MELIK, BLANCETT (MRN [id number]) as of 03/25/2018 10:45  Ref. Range 03/23/2018 16:56 03/24/2018 02:51  WBC Latest Ref Range: 4.0 - 10.5 K/uL 5.3 7.8  RBC Latest Ref Range: 4.22 - 5.81 MIL/uL 4.80 4.65  Hemoglobin Latest Ref Range: 13.0 - 17.0 g/dL 16.0 15.2  HCT Latest Ref Range: 39.0 - 52.0 % 45.4 44.0  MCV Latest Ref Range: 78.0 - 100.0 fL 94.6 94.6  MCH Latest Ref Range: 26.0 - 34.0 pg 33.3 32.7  MCHC Latest Ref Range: 30.0 - 36.0 g/dL 35.2 34.5  RDW Latest Ref Range: 11.5 - 15.5 % 12.8 12.9  Platelets Latest Ref Range: 150 - 400 K/uL 140 (L) 143 (L)   Results for BRIT, WERNETTE (MRN [id number]) as of 03/25/2018 10:45  Ref. Range 03/23/2018 17:01 03/23/2018 20:08 03/24/2018 02:51 03/24/2018 41:63  BASIC METABOLIC PANEL Unknown   Rpt (A)   Sodium Latest Ref Range: 135 - 145 mmol/L   137   Potassium Latest Ref Range: 3.5 - 5.1 mmol/L   3.6   Chloride Latest Ref Range: 101 - 111 mmol/L   107   CO2 Latest Ref Range: 22 - 32 mmol/L   21 (L)   Glucose Latest Ref Range: 65 - 99 mg/dL   123 (H)   BUN Latest Ref Range: 6 - 20 mg/dL   8   Creatinine Latest Ref Range: 0.61 - 1.24 mg/dL   1.00   Calcium Latest Ref Range: 8.9 - 10.3 mg/dL   8.5 (L)   Anion gap Latest Ref Range: 5 - 15    9  GFR, Est Non African American Latest Ref Range: >60 mL/min   >60   GFR, Est African American Latest Ref Range: >60 mL/min   >60   Troponin I Latest Ref Range: <0.03 ng/mL  0.09 (HH) 0.43 (HH) 0.48 (HH)  Troponin i, poc Latest Ref Range: 0.00 - 0.08 ng/mL 0.00     Total CHOL/HDL Ratio Latest Units: RATIO  4.0    Cholesterol Latest Ref Range: 0 - 200 mg/dL  117    HDL Cholesterol Latest Ref Range: >40 mg/dL  29 (L)    LDL (calc) Latest Ref Range: 0 - 99 mg/dL  77    Triglycerides Latest Ref Range: <150 mg/dL  55    VLDL Latest Ref Range: 0 - 40 mg/dL  11     Results for  TRAVARIS, KOSH (MRN [id number]) as of 03/25/2018 10:45  Ref. Range 03/23/2018 16:56 03/24/2018 02:51  Glucose Latest Ref Range: 65 - 99 mg/dL 130 (H) 123 (H)    Treatments: Cath 03/23/2018: LM: Normal LAD: Normal Ramus: Normal LCx: Mid 40% stenosis RCA: Prox-mid 80-95% stenoses  Successful primary PCI to prox-mid RCA Overlapping 4.0 x 33 mm and 4.0 x 12 mm Xience Sierra drug eluting stents  Coronary Diagrams   Diagnostic Diagram       Post-Intervention Diagram          Discharge Exam: Blood pressure (!) 95/59, pulse (!) 48, temperature 98.1 F (36.7 C), temperature source Oral, resp. rate 18, height 5\' 10"  (1.778 m), weight 75.9 kg (167 lb 6.4 oz), SpO2 96 %. Constitutional: He is oriented to person, place, and time and well-developed, well-nourished, and in no distress. No distress.  HENT:  Head: Atraumatic.  Eyes: Conjunctivae are normal.  Neck: Normal range of motion. Neck supple. JVD present.  Cardiovascular: Normal rate, regular rhythm, normal heart sounds and intact distal pulses. Exam reveals no gallop.  No murmur heard. Bilateral soft carotid bruit present  Pulmonary/Chest: Effort normal and breath sounds normal. No stridor. No respiratory distress.  Abdominal: Soft. Bowel sounds are normal.  Musculoskeletal: Normal range of motion.  Neurological: He is alert and oriented to person, place, and time.  Skin: Skin is warm and dry. He is not diaphoretic.  Psychiatric: Affect normal.      Disposition:   Discharge Instructions    Amb Referral to Cardiac Rehabilitation   Complete by:  As directed    Referring to [facility] [place]   Diagnosis:   STEMI Coronary Stents       Allergies as of 03/25/2018   No Known Allergies     Medication List    TAKE these medications   aspirin 81 MG chewable tablet Chew 1 tablet (81 mg total) by mouth daily. Start taking on:  03/26/2018   atorvastatin 80 MG tablet Commonly known as:  LIPITOR Take 1 tablet  (80 mg total) by mouth daily at 6 PM.   nicotine 14 mg/24hr patch Commonly known as:  NICODERM CQ - dosed in mg/24 hours Place 1 patch (14 mg total) onto the skin daily.   ticagrelor 90 MG Tabs tablet Commonly known as:  BRILINTA Take 1 tablet (90 mg total) by mouth 2 (two) times daily.        SignedNigel Mormon 03/25/2018, 10:29 AM   Nigel Mormon, MD [facility] Pager: [phone] Office: [phone] If no answer Cell [phone]

## 2019-04-25 IMAGING — CR DG CHEST 1V PORT
1 series · 1 of 1 positions shown · non-contrast
Comparison: Portable exam 7373 hours without priors for comparison

CLINICAL DATA: Chest pain with tingling sensation across chest
radiating to LEFT shoulder, diaphoretic, smoker, history bladder
cancer

EXAM:
PORTABLE CHEST 1 VIEW

[portable]
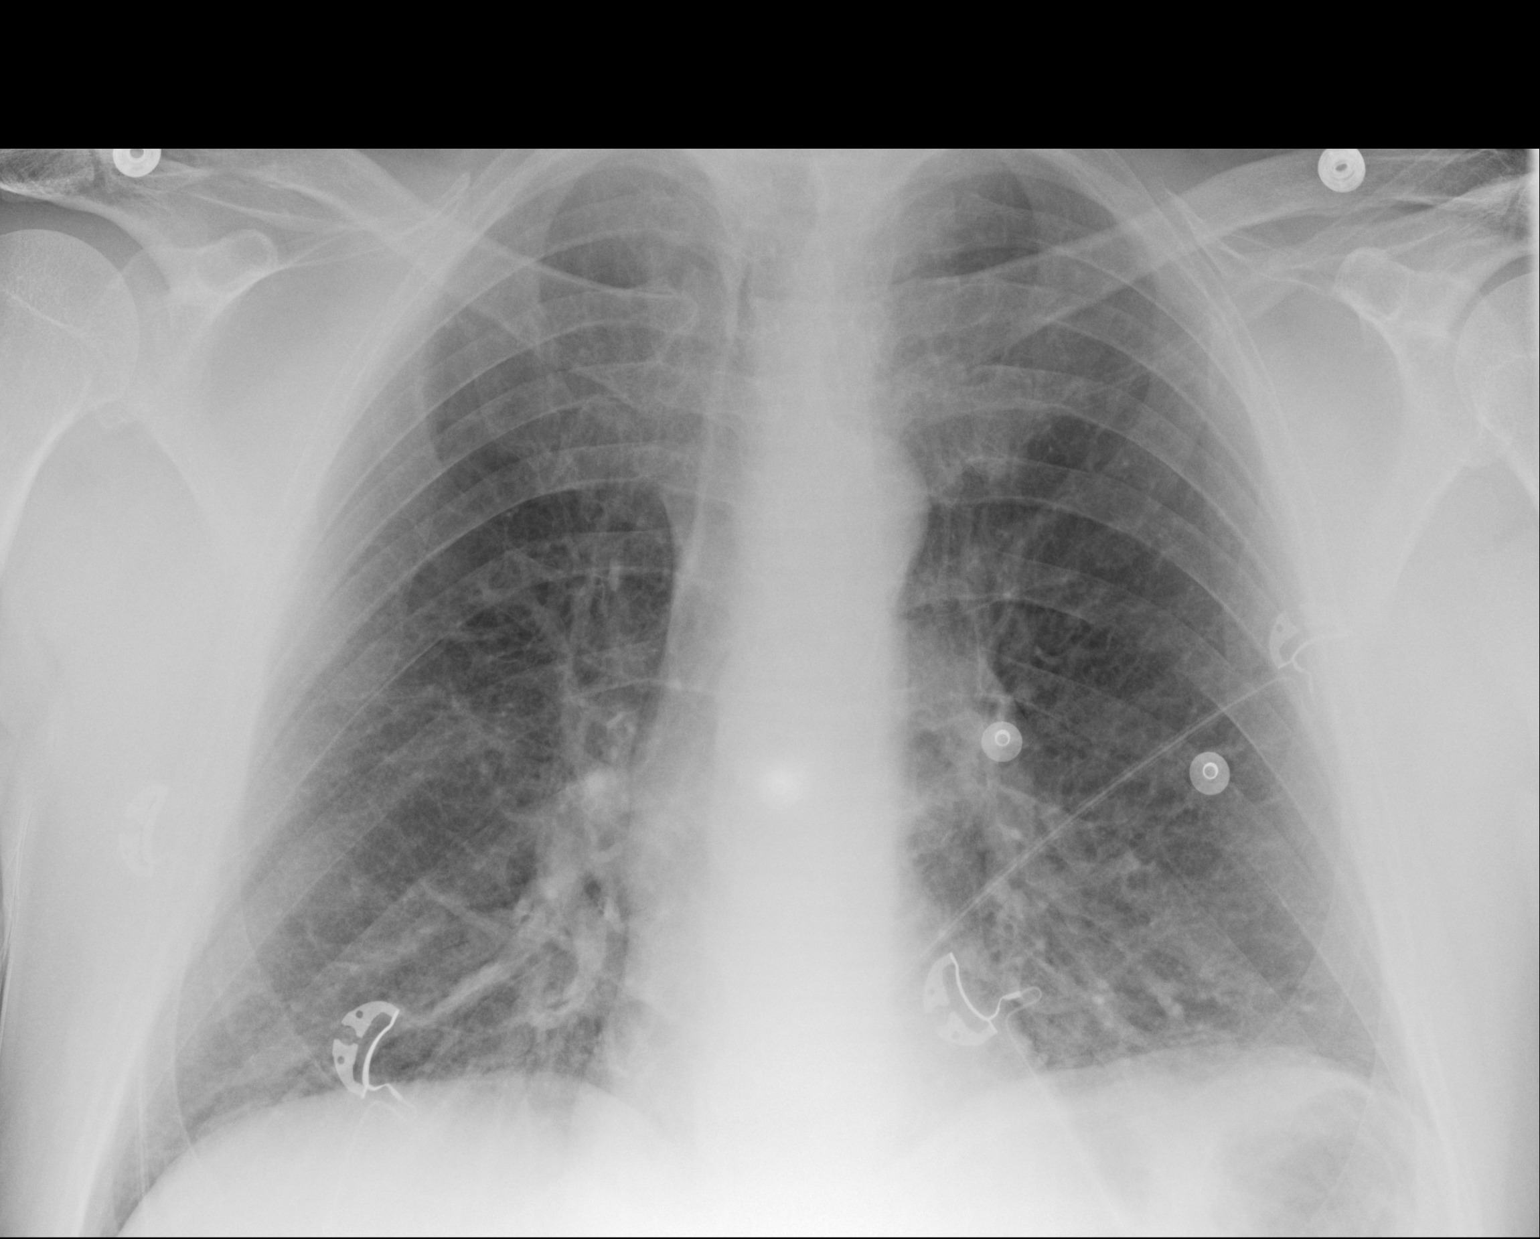

[1 of 1 positions shown; findings below may reference images not displayed]

FINDINGS: Normal heart size, mediastinal contours, and pulmonary vascularity.

Bibasilar atelectasis.

Lungs otherwise clear.

No pleural effusion or pneumothorax.

No definite osseous abnormalities.
IMPRESSION: Bibasilar atelectasis.
